# Patient Record
Sex: Female | Born: 1995 | Race: White | Hispanic: No | Marital: Single | Smoking: Never smoker
Health system: Southern US, Community
[De-identification: ages and names within clinical notes are randomized; demographics above are authoritative.]

## PROBLEM LIST (undated history)

## (undated) DIAGNOSIS — N63 Unspecified lump in unspecified breast: Secondary | ICD-10-CM

## (undated) HISTORY — PX: WISDOM TOOTH EXTRACTION: SHX21

---

## 2014-12-27 ENCOUNTER — Ambulatory Visit (INDEPENDENT_AMBULATORY_CARE_PROVIDER_SITE_OTHER): Payer: Managed Care, Other (non HMO) | Admitting: Obstetrics and Gynecology

## 2014-12-27 ENCOUNTER — Encounter: Payer: Self-pay | Admitting: Obstetrics and Gynecology

## 2014-12-27 VITALS — BP 100/66 | HR 74 | Resp 14 | Ht 64.0 in | Wt 125.3 lb

## 2014-12-27 DIAGNOSIS — Z30019 Encounter for initial prescription of contraceptives, unspecified: Secondary | ICD-10-CM

## 2014-12-27 LAB — POCT URINE PREGNANCY: Preg Test, Ur: NEGATIVE

## 2014-12-27 MED ORDER — IBUPROFEN 800 MG PO TABS
800.0000 mg | ORAL_TABLET | Freq: Three times a day (TID) | ORAL | Status: AC | PRN
Start: 2014-12-27 — End: ?

## 2014-12-27 NOTE — Progress Notes (Signed)
GYNECOLOGY CLINIC PROGRESS NOTE  Subjective:    Marissa Campbell is a 19 y.o. P0 female who presents for contraception counseling. The patient has no complaints today. The patient is currently sexually active. Pertinent past medical history: none.  Menstrual History: OB History    Gravida Para Term Preterm AB TAB SAB Ectopic Multiple Living   0 0 0 0 0 0 0 0 0 0       Menarche age: 19 Patient's last menstrual period was 12/18/2014.  Menses are regular, with moderate-heavy flow. Associated with moderate dysmenorrhea (not controlled with OTC pain meds).    History reviewed. No pertinent past medical history.  History reviewed. No pertinent past surgical history.  Family History  Problem Relation Age of Onset  . Gout Father     Social History   Social History  . Marital Status: Single    Spouse Name: N/A  . Number of Children: N/A  . Years of Education: N/A   Occupational History  . Not on file.   Social History Main Topics  . Smoking status: Never Smoker   . Smokeless tobacco: Not on file  . Alcohol Use: 3.6 oz/week    0 Standard drinks or equivalent, 6 Glasses of wine per week  . Drug Use: Not on file  . Sexual Activity: Yes   Other Topics Concern  . Not on file   Social History Narrative  . No narrative on file    No current outpatient prescriptions on file prior to visit.   No current facility-administered medications on file prior to visit.    Allergies  Allergen Reactions  . Penicillins Hives    . Review of Systems Pertinent items noted in HPI and remainder of comprehensive ROS otherwise negative.   Objective:    No exam performed today.  Deferred.  Assessment:    19 y.o., desiring contraception, no contraindications.   Dysmenorrhea  Plan:   - Contraception: Reviewed all forms of birth control options available including abstinence; over the counter/barrier methods; hormonal contraceptive medication including pill, patch, ring,  injection,contraceptive implant; hormonal and nonhormonal IUDs; did not discuss permanent sterilization methods. Risks and benefits reviewed.  Questions were answered.  Information was given to patient to review.    - Patient considering between [place] and IUD (likely Mirena).  To schedule for insertion of device in 3 weeks around time of next menses (patient to call office to schedule appointment for type of contraceptive desired).  - Ibuprofen 800 mg prescribed for dysmenorrhea.    A total of 20 minutes were spent face-to-face with the patient during this encounter and over half of that time dealt with counseling and coordination of care.   Rubie Maid, MD [facility]

## 2015-01-18 ENCOUNTER — Encounter: Payer: Self-pay | Admitting: Obstetrics and Gynecology

## 2015-01-18 ENCOUNTER — Ambulatory Visit (INDEPENDENT_AMBULATORY_CARE_PROVIDER_SITE_OTHER): Payer: Managed Care, Other (non HMO) | Admitting: Obstetrics and Gynecology

## 2015-01-18 VITALS — BP 100/62 | HR 96 | Ht 64.0 in | Wt 123.9 lb

## 2015-01-18 DIAGNOSIS — Z30430 Encounter for insertion of intrauterine contraceptive device: Secondary | ICD-10-CM | POA: Diagnosis not present

## 2015-01-18 NOTE — Patient Instructions (Signed)

## 2015-01-18 NOTE — Progress Notes (Signed)
  GYNECOLOGY CLINIC PROCEDURE NOTE  Marissa Campbell is a 19 y.o. G0P0000 here for Mirena IUD insertion. No GYN concerns. No prior pap history.   Blood pressure 100/62, pulse 96, height 5\' 4"  (1.626 m), weight 123 lb 14.4 oz (56.201 kg), last menstrual period 01/18/2015.  IUD Insertion Procedure Note Patient identified, informed consent performed, consent signed.   Discussed risks of irregular bleeding, cramping, infection, malpositioning or misplacement of the IUD outside the uterus which may require further procedure such as laparoscopy. Time out was performed.  Urine pregnancy not performed as patient currently on menses.  Speculum placed in the vagina.  Cervix visualized.  Cleaned with Betadine x 2.  Grasped anteriorly with a single tooth tenaculum.  Uterus sounded to 7 cm.  Mirena IUD placed per manufacturer's recommendations.  Strings trimmed to 3 cm. Tenaculum was removed, good hemostasis noted.  Patient tolerated procedure well.   Patient was given post-procedure instructions.  She was advised to have backup contraception for one week.  Patient was also asked to check IUD strings periodically and follow up in 4 weeks for IUD check.     Rubie Maid, MD [facility]

## 2015-11-23 ENCOUNTER — Ambulatory Visit
Admission: RE | Admit: 2015-11-23 | Discharge: 2015-11-23 | Disposition: A | Discharge status: Home / Self Care | Payer: Managed Care, Other (non HMO) | Source: Ambulatory Visit | Attending: Obstetrics & Gynecology | Admitting: Obstetrics & Gynecology

## 2015-11-23 ENCOUNTER — Other Ambulatory Visit: Payer: Self-pay | Admitting: Obstetrics & Gynecology

## 2015-11-23 DIAGNOSIS — N644 Mastodynia: Secondary | ICD-10-CM

## 2015-11-23 DIAGNOSIS — N63 Unspecified lump in breast: Secondary | ICD-10-CM | POA: Diagnosis not present

## 2015-11-23 HISTORY — DX: Unspecified lump in unspecified breast: N63.0

## 2015-11-27 ENCOUNTER — Encounter: Payer: Self-pay | Admitting: General Surgery

## 2015-11-27 ENCOUNTER — Telehealth: Payer: Self-pay | Admitting: General Surgery

## 2015-11-27 NOTE — Telephone Encounter (Signed)
I HAVE TRIED TO REACH PT SEVERAL TIMES ON C#[phone] AND THE VOICEMAIL BOX IS FULL.I'M TRYING TO REACH HER TO GET A LOCAL ADDRESS,FAX# OR HAVE PT PICK UP NEW PT PW FOR HER APPT SCHEDULE ON 12-05-15 @ 8:30 WITH DR BYRNETT/MTH      I WILL CONTINUE TO TRY & REACH PT.

## 2015-11-28 ENCOUNTER — Encounter: Payer: Self-pay | Admitting: *Deleted

## 2015-12-05 ENCOUNTER — Encounter: Payer: Self-pay | Admitting: General Surgery

## 2015-12-05 ENCOUNTER — Ambulatory Visit (INDEPENDENT_AMBULATORY_CARE_PROVIDER_SITE_OTHER): Payer: Managed Care, Other (non HMO) | Admitting: General Surgery

## 2015-12-05 VITALS — BP 96/62 | HR 72 | Resp 12 | Ht 64.0 in | Wt 122.0 lb

## 2015-12-05 DIAGNOSIS — N644 Mastodynia: Secondary | ICD-10-CM

## 2015-12-05 NOTE — Patient Instructions (Addendum)
The patient is aware to call back for any questions or concerns. Aleve or Advil as needed for comfort.

## 2015-12-05 NOTE — Progress Notes (Signed)
Patient ID: Marissa Campbell, female   DOB: Sep 30, 1995, 20 y.o.   MRN: [id number]  Chief Complaint  Patient presents with  . Other    right breast tenderness    HPI Marissa Campbell is a 20 y.o. female here today for a evaluation right breast tenderness. She noticed the tenderness about one month ago and is sore to lay down on. Denies any injury or trauma other than occasional elbowed. She states she feel the right breast is slightly larger and nipple is slightly downward.  Ultrasound done on 11/23/15. Patient states she does perform self breast checks. No family history of cancers.  No history of exogenous estrogen use or trauma to the breast.   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  HPI  Past Medical History:  Diagnosis Date  . Breast mass     Past Surgical History:  Procedure Laterality Date  . WISDOM TOOTH EXTRACTION      Family History  Problem Relation Age of Onset  . Gout Father     Social History Social History  Substance Use Topics  . Smoking status: Never Smoker  . Smokeless tobacco: Never Used  . Alcohol use 3.6 oz/week    6 Glasses of wine per week    Allergies  Allergen Reactions  . Penicillins Hives    Current Outpatient Prescriptions  Medication Sig Dispense Refill  . cetirizine (ZYRTEC) 10 MG tablet Take 10 mg by mouth daily.    . fluticasone (FLONASE) 50 MCG/ACT nasal spray Place 2 sprays into both nostrils daily.    Marland Kitchen ibuprofen (ADVIL,MOTRIN) 800 MG tablet Take 1 tablet (800 mg total) by mouth every 8 (eight) hours as needed. 60 tablet 1  . levonorgestrel (MIRENA) 20 MCG/24HR IUD 1 each by Intrauterine route once.     No current facility-administered medications for this visit.     Review of Systems Review of Systems  Constitutional: Negative.   Respiratory: Negative.   Cardiovascular: Negative.     Blood pressure 96/62, pulse 72, resp. rate 12, height 5\' 4"  (1.626 m), weight 122 lb (55.3 kg), last menstrual period  05/04/2015.  Physical Exam Physical Exam  Constitutional: She is oriented to person, place, and time. She appears well-developed and well-nourished.  HENT:  Mouth/Throat: Oropharynx is clear and moist.  Eyes: Conjunctivae are normal. No scleral icterus.  Neck: Neck supple.  Cardiovascular: Normal rate, regular rhythm and normal heart sounds.   Pulmonary/Chest: Effort normal and breath sounds normal. Right breast exhibits tenderness. Right breast exhibits no inverted nipple, no mass, no nipple discharge and no skin change. Left breast exhibits no inverted nipple, no mass, no nipple discharge, no skin change and no tenderness. Breast asymmetry: right breast is  one cup size bigger than left      Right breast is perhaps 1/2-1 cup size larger than the left. Symmetry is present. No palpable lesions. No effacement of the nipple-areolar complex.  Lymphadenopathy:    She has no cervical adenopathy.    She has no axillary adenopathy.  Neurological: She is alert and oriented to person, place, and time.  Skin: Skin is warm and dry.  Psychiatric: Her behavior is normal.    Data Reviewed PCP notes of 11/23/2015.  Right breast ultrasound dated 11/23/2015 reviewed. 6 x 8 x 9 mm nodule 1 cm from the nipple at the 9:00 position identified. 6 month follow-up recommended. BI-RADS-3.  The area of patient's maximum discomfort is well way from the area of ultrasound  abnormality.  Assessment    Benign breast exam, focal asymmetry ('new).    Plan    Observation alone is warranted at this time. Anti-inflammatories/Tylenol as needed for comfort.    Follow up in 6 months with office ultrasound.  Aleve or Advil as needed for comfort.  This information has been scribed by Gaspar Cola CMA.   Robert Bellow 12/07/2015, 2:00 PM

## 2015-12-05 NOTE — Progress Notes (Deleted)
Patient ID: Marissa Campbell, female   DOB: 11-15-1995, 20 y.o.   MRN: [id number]  Chief Complaint  Patient presents with  . Other    breast tenderness    HPI Marissa Campbell is a 20 y.o. female.  *** HPI  Past Medical History:  Diagnosis Date  . Breast mass     No past surgical history on file.  Family History  Problem Relation Age of Onset  . Gout Father     Social History Social History  Substance Use Topics  . Smoking status: Never Smoker  . Smokeless tobacco: Not on file  . Alcohol use 3.6 oz/week    6 Glasses of wine per week    Allergies  Allergen Reactions  . Penicillins Hives    Current Outpatient Prescriptions  Medication Sig Dispense Refill  . ibuprofen (ADVIL,MOTRIN) 800 MG tablet Take 1 tablet (800 mg total) by mouth every 8 (eight) hours as needed. 60 tablet 1  . levonorgestrel (MIRENA) 20 MCG/24HR IUD 1 each by Intrauterine route once.     No current facility-administered medications for this visit.     Review of Systems Review of Systems  Constitutional: Negative.   Respiratory: Negative.   Cardiovascular: Negative.     Last menstrual period 05/04/2015.  Physical Exam Physical Exam  Constitutional: She is oriented to person, place, and time. She appears well-developed and well-nourished.  Eyes: Conjunctivae are normal. No scleral icterus.  Neck: Neck supple.  Cardiovascular: Normal rate, regular rhythm and normal heart sounds.   Pulmonary/Chest: Effort normal and breath sounds normal. Right breast exhibits tenderness. Right breast exhibits no inverted nipple, no mass, no nipple discharge and no skin change. Left breast exhibits no inverted nipple, no mass, no nipple discharge, no skin change and no tenderness.  Lymphadenopathy:    She has no cervical adenopathy.  Neurological: She is alert and oriented to person, place, and time.  Skin: Skin is warm and dry.    Data Reviewed ***  Assessment    ***    Plan    ***     This  information has been scribed by Gaspar Cola CMA.   Gaspar Cola 12/05/2015, 8:19 AM

## 2015-12-07 DIAGNOSIS — N644 Mastodynia: Secondary | ICD-10-CM | POA: Insufficient documentation

## 2016-01-03 ENCOUNTER — Encounter: Payer: Self-pay | Admitting: Emergency Medicine

## 2016-01-03 ENCOUNTER — Emergency Department
Admission: EM | Admit: 2016-01-03 | Discharge: 2016-01-03 | Disposition: A | Discharge status: Home / Self Care | Payer: Managed Care, Other (non HMO) | Attending: Emergency Medicine | Admitting: Emergency Medicine

## 2016-01-03 DIAGNOSIS — Z791 Long term (current) use of non-steroidal anti-inflammatories (NSAID): Secondary | ICD-10-CM | POA: Insufficient documentation

## 2016-01-03 DIAGNOSIS — T7840XA Allergy, unspecified, initial encounter: Secondary | ICD-10-CM | POA: Diagnosis present

## 2016-01-03 DIAGNOSIS — J302 Other seasonal allergic rhinitis: Secondary | ICD-10-CM

## 2016-01-03 MED ORDER — DEXAMETHASONE SODIUM PHOSPHATE 10 MG/ML IJ SOLN
10.0000 mg | Freq: Once | INTRAMUSCULAR | Status: AC
  Administered 2016-01-03: 10 mg via INTRAMUSCULAR
  Filled 2016-01-03: qty 1

## 2016-01-03 NOTE — ED Triage Notes (Signed)
Pt states she thinks she's having an allergic reaction; reports bilateral eye redness and itching, and mouth itching. Pt reports taking benadryl and zyrtec yesterday with no relief. Pt airway intact, no resp distress noted.

## 2016-01-03 NOTE — Discharge Instructions (Signed)
Continue Benadryl at night, Zyrtec during the day and continue using Flonase nasal spray for allergies. Follow-up with [facility] clinic acute care or [facility] for any continued problems. Return to the emergency room immediately if any difficulty with breathing.

## 2016-01-03 NOTE — ED Notes (Addendum)
Pt in via triage; states, "I think I am having an allergic rxn."  Pt reports itching/redness to to bilateral eyes, swelling and itching to lips, swelling to tongue.  Pt denies any shortness of breath or difficulty breathing; pt able to speak in full clear sentences without any distress.  Pt denies eating any new foods.

## 2016-01-03 NOTE — ED Provider Notes (Signed)
[facility] Emergency Department Provider Note  ____________________________________________   First MD Initiated Contact with Patient 01/03/16 1311     (approximate)  I have reviewed the triage vital signs and the nursing notes.   HISTORY  Chief Complaint Allergic Reaction   HPI Marissa Campbell is a 20 y.o. female is here with complaint of questionable allergic reaction. Patient states that she had itching and redness to her eyes last evening. She also had some swelling and itching to her lips and possibly her tongue. Patient states that she did not have any difficulty breathing and was able to speak in complete sentences last evening without difficulty. She denies eating new food or taking any medication. Patient took Benadryl last evening and Zyrtec this morning but states there is been no relief. Patient has a history of seasonal allergies and takes Zyrtec every day. She is also taking Flonase nasal spray. At the present time patient denies any bilateral eye itching. She also denies any current itching of her lips or swelling to her tongue.   Past Medical History:  Diagnosis Date  . Breast mass     Patient Active Problem List   Diagnosis Date Noted  . Breast pain, right 12/07/2015    Past Surgical History:  Procedure Laterality Date  . WISDOM TOOTH EXTRACTION      Prior to Admission medications   Medication Sig Start Date End Date Taking? Authorizing Provider  cetirizine (ZYRTEC) 10 MG tablet Take 10 mg by mouth daily.    Historical Provider, MD  fluticasone (FLONASE) 50 MCG/ACT nasal spray Place 2 sprays into both nostrils daily.    Historical Provider, MD  ibuprofen (ADVIL,MOTRIN) 800 MG tablet Take 1 tablet (800 mg total) by mouth every 8 (eight) hours as needed. 12/27/14   Rubie Maid, MD  levonorgestrel (MIRENA) 20 MCG/24HR IUD 1 each by Intrauterine route once. 01/18/15   Historical Provider, MD    Allergies Penicillins  Family History    Problem Relation Age of Onset  . Gout Father     Social History Social History  Substance Use Topics  . Smoking status: Never Smoker  . Smokeless tobacco: Never Used  . Alcohol use 3.6 oz/week    6 Glasses of wine per week    Review of Systems Constitutional: No fever/chills Eyes: No visual changes.Bilateral eyes itching. ENT: No sore throat. Bilateral lips itching. Denies difficulty swallowing. Cardiovascular: Denies chest pain. Respiratory: Denies shortness of breath. Negative for difficulty breathing. Gastrointestinal:   No nausea, no vomiting.  Genitourinary: Negative for dysuria. Musculoskeletal: Negative for back pain. Skin: Negative for rash. Neurological: Negative for headaches, focal weakness or numbness.  10-point ROS otherwise negative.  ____________________________________________   PHYSICAL EXAM:  VITAL SIGNS: ED Triage Vitals [01/03/16 1255]  Enc Vitals Group     BP 110/61     Pulse Rate 67     Resp 16     Temp 98.8 F (37.1 C)     Temp Source Oral     SpO2 100 %     Weight 125 lb (56.7 kg)     Height 5\' 2"  (1.575 m)     Head Circumference      Peak Flow      Pain Score 3     Pain Loc      Pain Edu?      Excl. in [place]?     Constitutional: Alert and oriented. Well appearing and in no acute distress. Eyes: Conjunctivae are normal. PERRL.  EOMI. Head: Atraumatic. Nose: No congestion/rhinnorhea.  Mouth/Throat: Mucous membranes are moist.  Oropharynx non-erythematous. Airway intact and there is no edema noted in posterior pharynx. No edema is noted of the lips. Skin exteriorly there is some dryness which appears to be chapped lips. Patient is able to speak in complete sentences without any difficulty. There is no difficulty with swallowing. Neck: No stridor.   Hematological/Lymphatic/Immunilogical: No cervical lymphadenopathy. Cardiovascular: Normal rate, regular rhythm. Grossly normal heart sounds.  Good peripheral circulation. Respiratory: Normal  respiratory effort.  No retractions. Lungs CTAB. Musculoskeletal: No lower extremity tenderness nor edema.  No joint effusions. Neurologic:  Normal speech and language. No gross focal neurologic deficits are appreciated. No gait instability. Skin:  Skin is warm, dry and intact. No rash noted. Psychiatric: Mood and affect are normal. Speech and behavior are normal.  ____________________________________________   LABS (all labs ordered are listed, but only abnormal results are displayed)  Labs Reviewed - No data to display   PROCEDURES  Procedure(s) performed: None  Procedures  Critical Care performed: No  ____________________________________________   INITIAL IMPRESSION / ASSESSMENT AND PLAN / ED COURSE  Pertinent labs & imaging results that were available during my care of the patient were reviewed by me and considered in my medical decision making (see chart for details).    Clinical Course   Patient will continue taking Benadryl at night and Zyrtec each morning. She is encouraged to use Flonase nasal spray for allergies. She is also given a Decadron 10 mg IM injection while in the emergency room. There was no progression of any symptoms while she was here. Patient continues to talk with a friend of hers in the room without any difficulty. We discussed seasonal allergies. She is to follow-up with the doctor at the [facility]  or Dr. Pryor Ochoa at [facility].  ____________________________________________   FINAL CLINICAL IMPRESSION(S) / ED DIAGNOSES  Final diagnoses:  Seasonal allergic rhinitis, unspecified chronicity, unspecified trigger      NEW MEDICATIONS STARTED DURING THIS VISIT:  Discharge Medication List as of 01/03/2016  2:27 PM       Note:  This document was prepared using Dragon voice recognition software and may include unintentional dictation errors.    Johnn Hai, PA-C 01/03/16 [place], MD 01/04/16 1535

## 2016-05-08 ENCOUNTER — Ambulatory Visit: Payer: Self-pay | Admitting: General Surgery

## 2016-06-09 ENCOUNTER — Ambulatory Visit: Payer: Self-pay | Admitting: General Surgery

## 2016-06-24 ENCOUNTER — Ambulatory Visit
Admission: RE | Admit: 2016-06-24 | Discharge: 2016-06-24 | Disposition: A | Discharge status: Home / Self Care | Payer: Managed Care, Other (non HMO) | Source: Ambulatory Visit | Attending: Family Medicine | Admitting: Family Medicine

## 2016-06-24 ENCOUNTER — Other Ambulatory Visit: Payer: Self-pay | Admitting: Family Medicine

## 2016-06-24 DIAGNOSIS — X58XXXA Exposure to other specified factors, initial encounter: Secondary | ICD-10-CM | POA: Insufficient documentation

## 2016-06-24 DIAGNOSIS — S62667A Nondisplaced fracture of distal phalanx of left little finger, initial encounter for closed fracture: Secondary | ICD-10-CM | POA: Insufficient documentation

## 2016-06-24 DIAGNOSIS — T1490XA Injury, unspecified, initial encounter: Secondary | ICD-10-CM

## 2016-06-24 DIAGNOSIS — S6990XA Unspecified injury of unspecified wrist, hand and finger(s), initial encounter: Secondary | ICD-10-CM | POA: Diagnosis present

## 2016-07-09 ENCOUNTER — Encounter: Payer: Self-pay | Admitting: *Deleted

## 2016-07-21 ENCOUNTER — Inpatient Hospital Stay: Payer: Self-pay

## 2016-07-21 ENCOUNTER — Ambulatory Visit (INDEPENDENT_AMBULATORY_CARE_PROVIDER_SITE_OTHER): Payer: Managed Care, Other (non HMO) | Admitting: General Surgery

## 2016-07-21 ENCOUNTER — Encounter: Payer: Self-pay | Admitting: General Surgery

## 2016-07-21 VITALS — BP 118/68 | HR 72 | Resp 12 | Ht 62.0 in | Wt 118.0 lb

## 2016-07-21 DIAGNOSIS — D241 Benign neoplasm of right breast: Secondary | ICD-10-CM

## 2016-07-21 DIAGNOSIS — N644 Mastodynia: Secondary | ICD-10-CM
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
## 2016-07-21 NOTE — Patient Instructions (Signed)
Follow up in 6 month with right breast ultrasound.

## 2016-07-22 DIAGNOSIS — D241 Benign neoplasm of right breast: Secondary | ICD-10-CM | POA: Insufficient documentation

## 2017-01-14 ENCOUNTER — Inpatient Hospital Stay: Payer: Self-pay

## 2017-01-14 ENCOUNTER — Encounter: Payer: Self-pay | Admitting: General Surgery

## 2017-01-14 ENCOUNTER — Ambulatory Visit (INDEPENDENT_AMBULATORY_CARE_PROVIDER_SITE_OTHER): Payer: Managed Care, Other (non HMO) | Admitting: General Surgery

## 2017-01-14 VITALS — BP 98/62 | HR 64 | Resp 12 | Ht 62.0 in | Wt 122.0 lb

## 2017-01-14 DIAGNOSIS — N644 Mastodynia: Secondary | ICD-10-CM

## 2017-01-14 DIAGNOSIS — D241 Benign neoplasm of right breast: Secondary | ICD-10-CM

## 2017-01-14 NOTE — Patient Instructions (Addendum)
The patient is aware to call back for any questions or new concerns. Follow up in one year with right breast office ultrasound.

## 2017-01-14 NOTE — Progress Notes (Signed)
Patient ID: Marissa Campbell, female   DOB: [birth year], 21 y.o.   MRN: [id number]  Chief Complaint  Patient presents with  . Follow-up    HPI Marissa Campbell is a 21 y.o. female here today for her 6 month follow up right breast ultrasound. Denies any new breast issues. She denies any further breast pain.  HPI  Past Medical History:  Diagnosis Date  . Breast mass     Past Surgical History:  Procedure Laterality Date  . WISDOM TOOTH EXTRACTION      Family History  Problem Relation Age of Onset  . Gout Father     Social History Social History   Tobacco Use  . Smoking status: Never Smoker  . Smokeless tobacco: Never Used  Substance Use Topics  . Alcohol use: Yes    Alcohol/week: 3.6 oz    Types: 6 Glasses of wine per week  . Drug use: No    Allergies  Allergen Reactions  . Penicillins Hives  . Zithromax [Azithromycin] Hives    Current Outpatient Medications  Medication Sig Dispense Refill  . ARIPiprazole (ABILIFY) 10 MG tablet Take 10 mg by mouth daily.    Marland Kitchen buPROPion (WELLBUTRIN XL) 300 MG 24 hr tablet Take 300 mg by mouth daily.    . hydrOXYzine (ATARAX/VISTARIL) 25 MG tablet Take 25 mg daily by mouth.    Marland Kitchen ibuprofen (ADVIL,MOTRIN) 800 MG tablet Take 1 tablet (800 mg total) by mouth every 8 (eight) hours as needed. 60 tablet 1  . levonorgestrel (MIRENA) 20 MCG/24HR IUD 1 each by Intrauterine route once.    . lisdexamfetamine (VYVANSE) 20 MG capsule Take 20 mg by mouth daily.     No current facility-administered medications for this visit.     Review of Systems Review of Systems  Constitutional: Negative.   Respiratory: Negative.   Cardiovascular: Negative.     Blood pressure 98/62, pulse 64, resp. rate 12, height 5\' 2"  (1.575 m), weight 122 lb (55.3 kg).  Physical Exam Physical Exam  Constitutional: She is oriented to person, place, and time. She appears well-developed and well-nourished.  Pulmonary/Chest: Right breast exhibits no inverted nipple, no mass,  no nipple discharge, no skin change and no tenderness.    Neurological: She is alert and oriented to person, place, and time.  Skin: Skin is warm and dry.  Psychiatric: Her behavior is normal.    Data Reviewed Ultrasound examination of the right breast was completed to reassess the previously identified fibroadenoma in the 9 o'clock position, 4 cm from the nipple.  Today this measures 0.68 x 0.86 x 0.88 cm. At the time of her Jul 21, 2016 exam this area measured 0.66 x 0.75 x 0.88.  Minimal interval change.  BI-RADS-2.  Assessment    Right breast fibroadenoma, asymptomatic.    Plan    The area has remained stable over the past year.  A final exam in 1 year is been recommended.  She is completing her senior year at [employer] next month and will likely be moving out of state.  She was encouraged to have an exam wherever she lands.     The patient is aware to call back for any questions or new concerns.   HPI, Physical Exam, Assessment and Plan have been scribed under the direction and in the presence of Robert Bellow, MD. Karie Fetch, RN  I have completed the exam and reviewed the above documentation for accuracy and completeness.  I agree with the above.  Diplomatic Services operational officer  Technology has been used and any errors in dictation or transcription are unintentional.  Hervey Ard, M.D., F.A.C.S.  Robert Bellow 01/14/2017, 5:03 PM

## 2018-11-05 IMAGING — CR DG FINGER LITTLE 2+V*L*
1 series · 3 of 3 positions shown · non-contrast
Comparison: None.

CLINICAL DATA: 21-year-old female with a history of injury on
[REDACTED]

EXAM:
LEFT LITTLE FINGER 2+V

[Series 1: dg finger little left · 0.14mm/px · 3 of 3 slices shown]
[im 1/3]
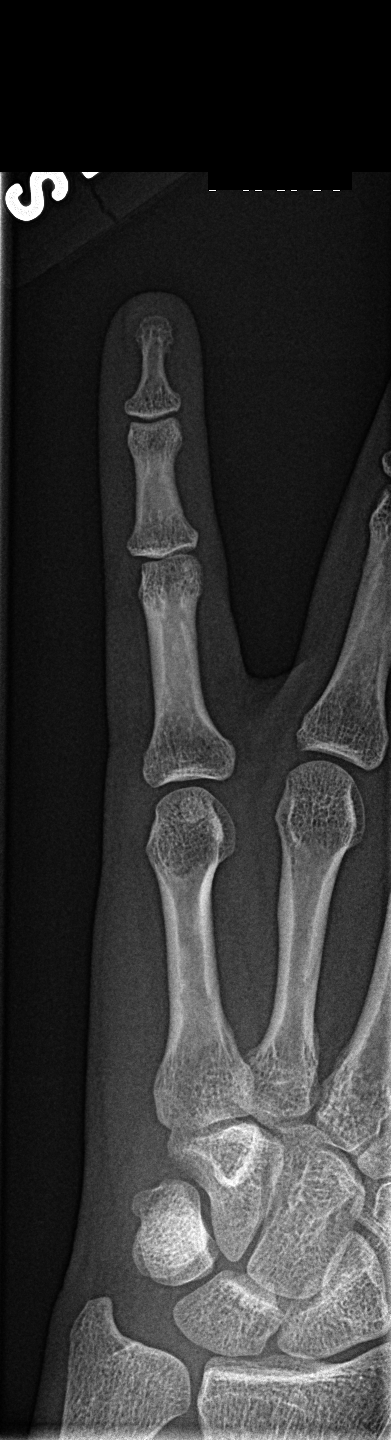
[im 2/3]
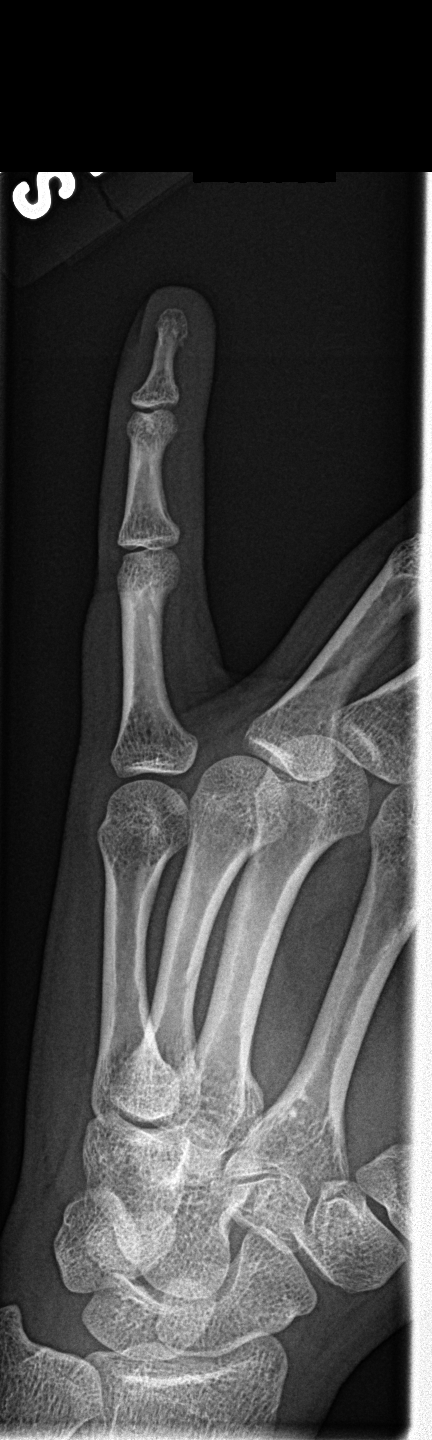
[im 3/3]
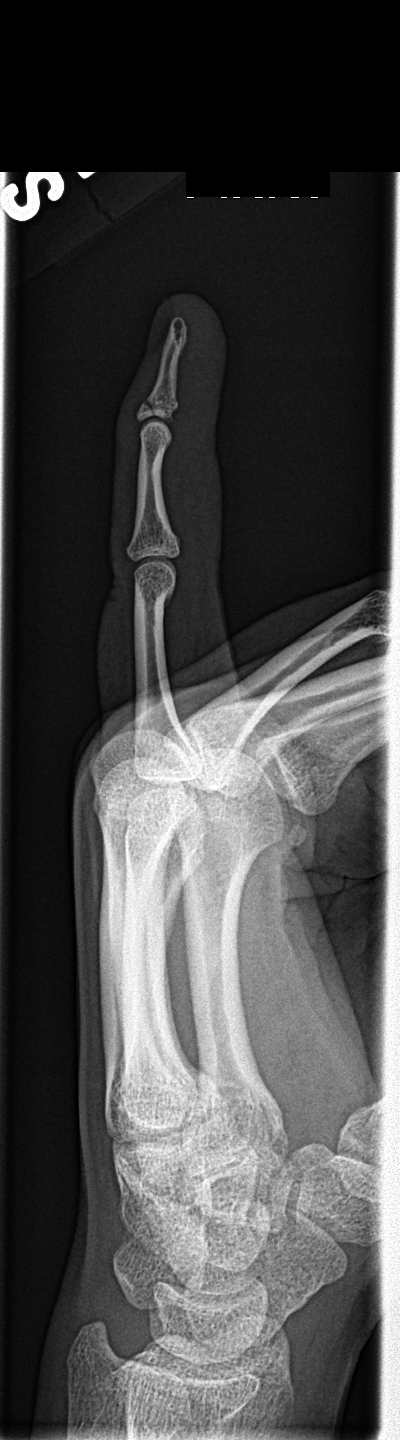

[3 of 3 positions shown; findings below may reference images not displayed]

FINDINGS: Lateral view demonstrates acute fracture at the base of the distal
phalanx of the fifth digit, left hand. Fracture line extends to the
joint space at the DIP.

Associated mild soft tissue swelling.  No radiopaque foreign body.
IMPRESSION: Acute fracture the base of the distal phalanx, fifth digit, left
hand, with the fracture line extending to the joint space. No
significant displacement
